# Patient Record
Sex: Male | Born: 1970 | Race: Black or African American | Hispanic: No | Smoking: Never smoker
Health system: Southern US, Community
[De-identification: ages and names within clinical notes are randomized; demographics above are authoritative.]

## PROBLEM LIST (undated history)

## (undated) DIAGNOSIS — T7840XA Allergy, unspecified, initial encounter: Secondary | ICD-10-CM

---

## 1999-05-30 ENCOUNTER — Emergency Department (HOSPITAL_COMMUNITY): Admission: EM | Admit: 1999-05-30 | Discharge: 1999-05-30 | Payer: Self-pay | Admitting: Internal Medicine

## 2000-04-15 ENCOUNTER — Emergency Department (HOSPITAL_COMMUNITY): Admission: EM | Admit: 2000-04-15 | Discharge: 2000-04-15 | Payer: Self-pay | Admitting: Emergency Medicine

## 2000-04-17 ENCOUNTER — Emergency Department (HOSPITAL_COMMUNITY): Admission: EM | Admit: 2000-04-17 | Discharge: 2000-04-17 | Payer: Self-pay | Admitting: Emergency Medicine

## 2002-04-07 ENCOUNTER — Emergency Department (HOSPITAL_COMMUNITY): Admission: EM | Admit: 2002-04-07 | Discharge: 2002-04-07 | Payer: Self-pay | Admitting: Emergency Medicine

## 2011-07-13 ENCOUNTER — Encounter (HOSPITAL_COMMUNITY): Payer: Self-pay | Admitting: Emergency Medicine

## 2011-07-13 ENCOUNTER — Emergency Department (HOSPITAL_COMMUNITY)
Admission: EM | Admit: 2011-07-13 | Discharge: 2011-07-13 | Disposition: A | Discharge status: Home / Self Care | Payer: PRIVATE HEALTH INSURANCE | Attending: Emergency Medicine | Admitting: Emergency Medicine

## 2011-07-13 DIAGNOSIS — M542 Cervicalgia: Secondary | ICD-10-CM | POA: Insufficient documentation

## 2011-07-13 DIAGNOSIS — B349 Viral infection, unspecified: Secondary | ICD-10-CM

## 2011-07-13 DIAGNOSIS — B9789 Other viral agents as the cause of diseases classified elsewhere: Secondary | ICD-10-CM | POA: Insufficient documentation

## 2011-07-13 DIAGNOSIS — R059 Cough, unspecified: Secondary | ICD-10-CM | POA: Insufficient documentation

## 2011-07-13 DIAGNOSIS — R51 Headache: Secondary | ICD-10-CM | POA: Insufficient documentation

## 2011-07-13 DIAGNOSIS — R05 Cough: Secondary | ICD-10-CM | POA: Insufficient documentation

## 2011-07-13 MED ORDER — NAPROXEN 500 MG PO TABS: 500.0000 mg | ORAL_TABLET | Freq: Two times a day (BID) | ORAL | Status: AC

## 2011-07-13 MED ORDER — IBUPROFEN 800 MG PO TABS
800.0000 mg | ORAL_TABLET | Freq: Once | ORAL | Status: AC
  Administered 2011-07-13: 800 mg via ORAL
  Filled 2011-07-13: qty 1

## 2011-07-13 NOTE — ED Provider Notes (Signed)
History     CSN: [id number]  Arrival date & time 07/13/11  1005   First MD Initiated Contact with Patient 07/13/11 1044      Chief Complaint  Patient presents with  . Headache    Headache x 4 days, worse in the am    (Consider location/radiation/quality/duration/timing/severity/associated sxs/prior treatment) HPI Patient states he had a headache it's been off and on for the last 4 days.  That time she's had some discomfort in his neck. He is felt feverish as well and has had a little bit of a cough. He denies any sore throat or nasal congestion. He has not had any vomiting or diarrhea. He has not had any trouble with his balance or coordination. No focal numbness or weakness. Is not fusion. The headache has been waxing and waning. This morning it was more severe but it has mostly resolved at this point. Patient has not seen any one else prior to this. He has been trying some Allegra-D over-the-counter. No past medical history on file.  No past surgical history on file.  No family history on file.  History  Substance Use Topics  . Smoking status: Not on file  . Smokeless tobacco: Not on file  . Alcohol Use: Not on file      Review of Systems  All other systems reviewed and are negative.    Allergies  Review of patient's allergies indicates no known allergies.  Home Medications   Current Outpatient Rx  Name Route Sig Dispense Refill  . FEXOFENADINE-PSEUDOEPHED ER 180-240 MG PO TB24 Oral Take 1 tablet by mouth daily.      BP 117/70  Pulse 62  Temp(Src) 99.8 F (37.7 C) (Oral)  Resp 18  SpO2 100%  Physical Exam  Nursing note and vitals reviewed. Constitutional: He is oriented to person, place, and time. He appears well-developed and well-nourished. No distress.  HENT:  Head: Normocephalic and atraumatic.  Right Ear: External ear normal.  Left Ear: External ear normal.  Eyes: Conjunctivae are normal. Right eye exhibits no discharge. Left eye exhibits no  discharge. No scleral icterus.  Neck: Neck supple. No tracheal deviation present.       No pain with range of motion, no meningeal signs, mild lymphadenopathy left anterior cervical region  Cardiovascular: Normal rate, regular rhythm and intact distal pulses.   Pulmonary/Chest: Effort normal and breath sounds normal. No stridor. No respiratory distress. He has no wheezes. He has no rales.  Abdominal: Soft. Bowel sounds are normal. He exhibits no distension. There is no tenderness. There is no rebound and no guarding.  Musculoskeletal: He exhibits no edema and no tenderness.  Neurological: He is alert and oriented to person, place, and time. He has normal strength. No cranial nerve deficit ( no gross defecits noted) or sensory deficit. He exhibits normal muscle tone. He displays no seizure activity. Coordination and gait normal.       No focal neurologic deficits, 5 over 5 strength upper and lower extremities, sensation to light touch is intact throughout  Skin: Skin is warm and dry. No rash noted.  Psychiatric: He has a normal mood and affect.    ED Course  Procedures (including critical care time)  Labs Reviewed - No data to display No results found.   No diagnosis found.    MDM  Patient does not show any evidence to suggest meningitis. His symptoms rather mild. It is possible he has flu type illness although he does not have any significant  amount of cough. Patient has a normal neurologic exam. He is not febrile here. I will treat him with ibuprofen and have him followup with a primary care Dr. to be rechecked if not better within the week. He is encouraged to return to emergency room for worsening symptoms       Celene Kras, MD 07/13/11 1054

## 2011-07-13 NOTE — ED Notes (Signed)
Pt reports headache x 4 days

## 2011-07-13 NOTE — Discharge Instructions (Signed)
Headache °Headaches are caused by many different problems. Most commonly, headache is caused by muscle tension from an injury, fatigue, or emotional upset. Excessive muscle contractions in the scalp and neck result in a headache that often feels like a tight band around the head. Tension headaches often have areas of tenderness over the scalp and the back of the neck. These headaches may last for hours, days, or longer, and some may contribute to migraines in those who have migraine problems. °Migraines usually cause a throbbing headache, which is made worse by activity. Sometimes only one side of the head hurts. Nausea, vomiting, eye pain, and avoidance of food are common with migraines. Visual symptoms such as light sensitivity, blind spots, or flashing lights may also occur. Loud noises may worsen migraine headaches. Many factors may cause migraine headaches: °· Emotional stress, lack of sleep, and menstrual periods.  °· Alcohol and some drugs (such as birth control pills).  °· Diet factors (fasting, caffeine, food preservatives, chocolate).  °· Environmental factors (weather changes, bright lights, odors, smoke).  °Other causes of headaches include minor injuries to the head. Arthritis in the neck; problems with the jaw, eyes, ears, or nose are also causes of headaches. Allergies, drugs, alcohol, and exposure to smoke can also cause moderate headaches. Rebound headaches can occur if someone uses pain medications for a long period of time and then stops. Less commonly, blood vessel problems in the neck and brain (including stroke) can cause various types of headache. °Treatment of headaches includes medicines for pain and relaxation. Ice packs or heat applied to the back of the head and neck help some people. Massaging the shoulders, neck and scalp are often very useful. Relaxation techniques and stretching can help prevent these headaches. Avoid alcohol and cigarette smoking as these tend to make headaches  worse. Please see your caregiver if your headache is not better in 2 days.  °SEEK IMMEDIATE MEDICAL CARE IF:  °· You develop a high fever, chills, or repeated vomiting.  °· You faint or have difficulty with vision.  °· You develop unusual numbness or weakness of your arms or legs.  °· Relief of pain is inadequate with medication, or you develop severe pain.  °· You develop confusion, or neck stiffness.  °· You have a worsening of a headache or do not obtain relief.  °Document Released: 06/14/2005 Document Revised: 02/24/2011 Document Reviewed: 12/08/2006 °[employer]® Patient Information ©2012 [employer]. °

## 2012-09-11 ENCOUNTER — Encounter (HOSPITAL_COMMUNITY): Payer: Self-pay | Admitting: *Deleted

## 2012-09-11 ENCOUNTER — Emergency Department (HOSPITAL_COMMUNITY): Payer: Worker's Compensation

## 2012-09-11 ENCOUNTER — Emergency Department (HOSPITAL_COMMUNITY)
Admission: EM | Admit: 2012-09-11 | Discharge: 2012-09-11 | Disposition: A | Discharge status: Home / Self Care | Payer: Worker's Compensation | Attending: Emergency Medicine | Admitting: Emergency Medicine

## 2012-09-11 DIAGNOSIS — Y9269 Other specified industrial and construction area as the place of occurrence of the external cause: Secondary | ICD-10-CM | POA: Insufficient documentation

## 2012-09-11 DIAGNOSIS — M7700 Medial epicondylitis, unspecified elbow: Secondary | ICD-10-CM | POA: Insufficient documentation

## 2012-09-11 DIAGNOSIS — M7701 Medial epicondylitis, right elbow: Secondary | ICD-10-CM

## 2012-09-11 DIAGNOSIS — Z79899 Other long term (current) drug therapy: Secondary | ICD-10-CM | POA: Insufficient documentation

## 2012-09-11 DIAGNOSIS — IMO0002 Reserved for concepts with insufficient information to code with codable children: Secondary | ICD-10-CM | POA: Insufficient documentation

## 2012-09-11 DIAGNOSIS — Y990 Civilian activity done for income or pay: Secondary | ICD-10-CM | POA: Insufficient documentation

## 2012-09-11 MED ORDER — NAPROXEN 500 MG PO TABS: 500.0000 mg | ORAL_TABLET | Freq: Two times a day (BID) | ORAL | Status: DC

## 2012-09-11 NOTE — ED Provider Notes (Signed)
Medical screening examination/treatment/procedure(s) were performed by non-physician practitioner and as supervising physician I was immediately available for consultation/collaboration.   Lyanne Co, MD 09/11/12 [phone]

## 2012-09-11 NOTE — ED Provider Notes (Signed)
History     CSN: [id number]  Arrival date & time 09/11/12  6213   First MD Initiated Contact with Patient 09/11/12 [phone]      Chief Complaint  Patient presents with  . Arm Pain    right    (Consider location/radiation/quality/duration/timing/severity/associated sxs/prior treatment) HPI Comments: Pt presents to the ED for right elbow pain.  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  Did not have the injury evaluated and states pain was short lived.  Over the past few days pain has increased and he is having intermittent numbness and paresthesias of his right hand.  Recently changed his position at work and now he is doing more repetitive movements.  Pain has now shifted to the medial aspect of his elbow. No prior right elbow injury.  Patient is a 42 y.o. male presenting with arm pain. The history is provided by the patient.  Arm Pain Associated symptoms include arthralgias.    History reviewed. No pertinent past medical history.  History reviewed. No pertinent past surgical history.  Family History  Problem Relation Age of Onset  . Hypertension Father     History  Substance Use Topics  . Smoking status: Never Smoker   . Smokeless tobacco: Not on file  . Alcohol Use: No      Review of Systems  Musculoskeletal: Positive for arthralgias.  All other systems reviewed and are negative.    Allergies  Review of patient's allergies indicates no known allergies.  Home Medications   Current Outpatient Rx  Name  Route  Sig  Dispense  Refill  . fexofenadine-pseudoephedrine (ALLEGRA-D 24) 180-240 MG per 24 hr tablet   Oral   Take 1 tablet by mouth daily.           BP 123/88  Pulse 54  Temp(Src) 97 F (36.1 C) (Oral)  Resp 18  SpO2 100%  Physical Exam  Nursing note and vitals reviewed. Constitutional: He is oriented to person, place, and time. He appears well-developed and well-nourished.  HENT:  Head:  Normocephalic and atraumatic.  Eyes: Conjunctivae and EOM are normal.  Neck: Normal range of motion.  Cardiovascular: Normal rate, regular rhythm and normal heart sounds.   Pulmonary/Chest: Effort normal and breath sounds normal. He has no wheezes.  Musculoskeletal: Normal range of motion.       Right elbow: He exhibits normal range of motion, no swelling, no deformity and no laceration. Tenderness found. Medial epicondyle tenderness noted.  Normal sensation in right extremitity  Neurological: He is alert and oriented to person, place, and time.  Skin: Skin is warm and dry.  Psychiatric: He has a normal mood and affect.    ED Course  Procedures (including critical care time)  Labs Reviewed - No data to display Dg Elbow Complete Right  09/11/2012  *RADIOLOGY REPORT*  Clinical Data: Elbow pain.  RIGHT ELBOW - COMPLETE 3+ VIEW  Comparison: None.  Findings: No effusion.  No fracture.  Anatomic alignment of the elbow.  Soft tissues appear normal.  IMPRESSION: Negative.   Original Report Authenticated By: Andreas Newport, M.D.      1. Medial epicondylitis of elbow, right       MDM   42 y.o. Male presents to the ED for right elbow pain after an injury approx 2 weeks ago at work.  Recent changes to his position at work now requiring him to do repetitive movements.  Subsequently pain is  located along medial epicondyle.  X-rays negative for acute fx.  Most likely golfers elbow.  No hx of renal impairment, will start on naprosyn daily.  Also instructed that he may also use an elbow brace for added relief, especially while working.  Does not currently have PCP, given resource list and encouraged to follow up.  Return precautions discussed.        Garlon Hatchet, PA-C 09/11/12 [phone]

## 2012-09-11 NOTE — ED Notes (Signed)
Patient states injured arm x 2 weeks ago but over past few days right elbow pain increasing, patient initially hurt elbow by hitting it with chain while working

## 2013-08-31 ENCOUNTER — Ambulatory Visit: Payer: Self-pay | Admitting: Physician Assistant

## 2013-08-31 VITALS — BP 110/60 | HR 50 | Temp 98.5°F | Resp 16 | Ht 71.5 in | Wt 240.0 lb

## 2013-08-31 DIAGNOSIS — Z0289 Encounter for other administrative examinations: Secondary | ICD-10-CM

## 2013-08-31 NOTE — Progress Notes (Signed)
U/A SpGr- 1.015 Protein- Trace Blood- Neg Glucose- Neg

## 2013-08-31 NOTE — Progress Notes (Signed)
Patient ID: Christopher RoyalWilliam M Brumbach MRN: [id number], DOB: 12/05/1970 43 y.o. Date of Encounter: 08/31/2013, 2:44 PM  Primary Physician: No primary provider on file.  Chief Complaint: DOT Physical   HPI: 43 y.o. male with history noted below here for DOT physical. Doing well. No issues/complaints. Recertification.  PCP: None PMH: Seasonal allergies. He sometimes takes Care[employer] adviserAllegra for this. He does does suffer any adverse effects from this medication.  Social: Alcohol denies, tobacco denies, and drug denies Family: See chart No history of MI or OSA No SI or HI No hallucinations From [place]  Review of Systems:  Consitutional: No fever, chills, fatigue, night sweats, lymphadenopathy, or weight changes. Eyes: No visual changes, eye redness, or discharge. ENT/Mouth: Ears: No otalgia, tinnitus, hearing loss, discharge. Nose: No congestion, rhinorrhea, sinus pain, or epistaxis. Throat: No sore throat, post nasal drip, or teeth pain. Cardiovascular: No CP, palpitations, diaphoresis, DOE, edema, orthopnea, PND. Respiratory: No cough, hemoptysis, SOB, or wheezing. Gastrointestinal: No anorexia, dysphagia, reflux, pain, nausea, vomiting, hematemesis, diarrhea, constipation, BRBPR, or melena. Genitourinary: No dysuria, frequency, urgency, hematuria, incontinence, nocturia, decreased urinary stream, discharge, impotence, or testicular pain/masses. Musculoskeletal: No decreased ROM, myalgias, stiffness, joint swelling, or weakness. Skin: No rash, erythema, lesion changes, pain, warmth, jaundice, or pruritis. Neurological: No headache, dizziness, syncope, seizures, tremors, memory loss, coordination problems, or paresthesias. Psychological: No anxiety, depression, hallucinations, SI/HI. Endocrine: No fatigue, polydipsia, polyphagia, polyuria, or known diabetes.   History reviewed. No pertinent past medical history.   History reviewed. No pertinent past surgical history.  Home Meds:  Prior to  Admission medications   Medication Sig Start Date End Date Taking? Authorizing Provider  fexofenadine-pseudoephedrine (ALLEGRA-D 24) 180-240 MG per 24 hr tablet Take 1 tablet by mouth daily.   Yes Historical Provider, MD    Allergies: No Known Allergies  History   Social History  . Marital Status: Married    Spouse Name: N/A    Number of Children: N/A  . Years of Education: N/A   Occupational History  . Not on file.   Social History Main Topics  . Smoking status: Never Smoker   . Smokeless tobacco: Not on file  . Alcohol Use: No  . Drug Use: Not on file  . Sexual Activity: Not on file   Other Topics Concern  . Not on file   Social History Narrative  . No narrative on file    Family History  Problem Relation Age of Onset  . Hypertension Father   . Stroke Father   . Thyroid disease Mother     Physical Exam: Blood pressure 110/60, pulse 50, temperature 98.5 F (36.9 C), temperature source Oral, resp. rate 16, height 5' 11.5" (1.816 m), weight 240 lb (108.863 kg), SpO2 100.00%. General: Well developed, well nourished, in no acute distress. HEENT: Normocephalic, atraumatic. Conjunctiva pink, sclera non-icteric. Pupils 2 mm constricting to 1 mm, round, regular, and equally reactive to light and accomodation. EOMI. Internal auditory canal clear. TMs with good cone of light and without pathology. Nasal mucosa pink. Nares are without discharge. No sinus tenderness. Oral mucosa pink. Dentition normal. Pharynx without exudate.   Neck: Supple. Trachea midline. No thyromegaly. Full ROM. No lymphadenopathy. Lungs: Clear to auscultation bilaterally without wheezes, rales, or rhonchi. Breathing is of normal effort and unlabored. Cardiovascular: RRR with S1 S2. No murmurs, rubs, or gallops appreciated. Distal pulses 2+ symmetrically. No carotid or abdominal bruits. Abdomen: Soft, non-tender, non-distended with normoactive bowel sounds. No hepatosplenomegaly or masses. No  rebound/guarding. No CVA  tenderness. Without hernias.  Genitourinary: Uncircumcised male. No penile lesions. Testes descended bilaterally, and smooth without tenderness or masses.  Musculoskeletal: Full range of motion and 5/5 strength throughout. Without swelling, atrophy, tenderness, crepitus, or warmth. Extremities without clubbing, cyanosis, or edema. Calves supple. Skin: Warm and moist without erythema, ecchymosis, wounds, or rash. Neuro: A+Ox3. CN II-XII grossly intact. Moves all extremities spontaneously. Full sensation throughout. Normal gait. DTR 2+ throughout upper and lower extremities. Finger to nose intact. Psych:  Responds to questions appropriately with a normal affect.    Assessment/Plan:  43 y.o. male here for DOT physical. -Cleared -2 year card issued -Form completed -RTC prn  Signed, Eula Listen, MHS, PA-C [facility] [place], [place] [address] [phone] [facility] 08/31/2013 2:44 PM

## 2014-05-18 ENCOUNTER — Ambulatory Visit (INDEPENDENT_AMBULATORY_CARE_PROVIDER_SITE_OTHER): Payer: Self-pay | Admitting: Emergency Medicine

## 2014-05-18 VITALS — BP 120/70 | HR 60 | Temp 98.0°F | Resp 18 | Ht 71.5 in | Wt 238.0 lb

## 2014-05-18 DIAGNOSIS — Z008 Encounter for other general examination: Secondary | ICD-10-CM

## 2014-05-18 DIAGNOSIS — Z0289 Encounter for other administrative examinations: Secondary | ICD-10-CM

## 2014-05-18 NOTE — Progress Notes (Signed)
° °  Subjective:    Patient ID: Christopher Hopkins, male    DOB: 12/08/1970, 43 y.o.   MRN: [id number] This chart was scribed for Christopher GobbleSteven A Daub, MD by Christopher Hopkins, ED Scribe. The patient was seen in Room 11. The patient's care was started at 4:11 PM.   05/18/2014  Chief Complaint  Patient presents with   Employment Physical    dot    HPI HPI Comments: Christopher Hopkins is a 43 y.o. male who presents to the [facility] here for DOT Physical exam. Pt denies all illness or injury in the 5 years. He denies having any prior DOT restrictions except for vision impairment. Pt denies any chronic medical conditions or taking medications. Pt denies seizures, loss of hearing, heart surgery, HTN, muscular disease, SOB, lung disease, kidney disease, liver disease, digestive problems, diabetes, nervous or psychiatric disorders, LOC, syncope, dizziness, weakness, numbness, sleep disorders, stroke, low back pain, or spinal injury.   Review of Systems  Respiratory: Negative for shortness of breath.   Musculoskeletal: Negative for back pain.  Neurological: Negative for dizziness, syncope, weakness and numbness.  Psychiatric/Behavioral: Negative for sleep disturbance and dysphoric mood. The patient is not nervous/anxious.     History reviewed. No pertinent past medical history. History reviewed. No pertinent past surgical history. No Known Allergies Current Outpatient Prescriptions  Medication Sig Dispense Refill   fexofenadine-pseudoephedrine (ALLEGRA-D 24) 180-240 MG per 24 hr tablet Take 1 tablet by mouth daily.     No current facility-administered medications for this visit.       Objective:  Triage Vitals: BP 120/70 mmHg   Pulse 60   Temp(Src) 98 F (36.7 C) (Oral)   Resp 18   Ht 5' 11.5" (1.816 m)   Wt 238 lb (107.956 kg)   BMI 32.74 kg/m2   SpO2 100%  Physical Exam  Constitutional: He is oriented to person, place, and time. He appears well-developed and  well-nourished. No distress.  HENT:  Head: Normocephalic and atraumatic.  Eyes: Conjunctivae and EOM are normal.  Neck: Neck supple. No tracheal deviation present.  Cardiovascular: Normal rate.   Pulmonary/Chest: Effort normal. No respiratory distress.  Musculoskeletal: Normal range of motion.  Neurological: He is alert and oriented to person, place, and time.  Skin: Skin is warm and dry.  Psychiatric: He has a normal mood and affect. His behavior is normal.  Nursing note and vitals reviewed.  No results found for this or any previous visit.     Assessment & Plan:  4:18 PM- Patient informed of current plan for treatment and evaluation and agrees with plan at this time. Patient qualifies for a two-year DOT card.I personally performed the services described in this documentation, which was scribed in my presence. The recorded information has been reviewed and is accurate.

## 2014-05-18 NOTE — Progress Notes (Deleted)
   Subjective:    Patient ID: Christopher RoyalWilliam M Hopkins, male    DOB: 06/08/1971, 43 y.o.   MRN: [id number]  HPI    Review of Systems     Objective:   Physical Exam        Assessment & Plan:

## 2015-10-16 ENCOUNTER — Encounter (HOSPITAL_COMMUNITY): Payer: Self-pay

## 2015-10-16 DIAGNOSIS — N390 Urinary tract infection, site not specified: Secondary | ICD-10-CM | POA: Insufficient documentation

## 2015-10-16 LAB — URINALYSIS, ROUTINE W REFLEX MICROSCOPIC
BILIRUBIN URINE: NEGATIVE
GLUCOSE, UA: NEGATIVE mg/dL
KETONES UR: NEGATIVE mg/dL
Nitrite: NEGATIVE
PROTEIN: NEGATIVE mg/dL
Specific Gravity, Urine: 1.024 (ref 1.005–1.030)
pH: 5.5 (ref 5.0–8.0)

## 2015-10-16 LAB — URINE MICROSCOPIC-ADD ON

## 2015-10-16 NOTE — ED Notes (Signed)
Pt here with c/o one episode of hematuria today just PTA. He denies dysuria or any other pain and denies prostrate problems. This is the first time he has had blood in his urine.

## 2015-10-17 ENCOUNTER — Emergency Department (HOSPITAL_COMMUNITY)
Admission: EM | Admit: 2015-10-17 | Discharge: 2015-10-17 | Disposition: A | Discharge status: Home / Self Care | Payer: PRIVATE HEALTH INSURANCE | Attending: Emergency Medicine | Admitting: Emergency Medicine

## 2015-10-17 DIAGNOSIS — N390 Urinary tract infection, site not specified: Secondary | ICD-10-CM

## 2015-10-17 DIAGNOSIS — R319 Hematuria, unspecified: Secondary | ICD-10-CM

## 2015-10-17 MED ORDER — CIPROFLOXACIN HCL 500 MG PO TABS: 500.0000 mg | ORAL_TABLET | Freq: Two times a day (BID) | ORAL | Status: AC

## 2015-10-17 NOTE — Discharge Instructions (Signed)
Urinary Tract Infection °Urinary tract infections (UTIs) can develop anywhere along your urinary tract. Your urinary tract is your body's drainage system for removing wastes and extra water. Your urinary tract includes two kidneys, two ureters, a bladder, and a urethra. Your kidneys are a pair of bean-shaped organs. Each kidney is about the size of your fist. They are located below your ribs, one on each side of your spine. °CAUSES °Infections are caused by microbes, which are microscopic organisms, including fungi, viruses, and bacteria. These organisms are so small that they can only be seen through a microscope. Bacteria are the microbes that most commonly cause UTIs. °SYMPTOMS  °Symptoms of UTIs may vary by age and gender of the patient and by the location of the infection. Symptoms in young women typically include a frequent and intense urge to urinate and a painful, burning feeling in the bladder or urethra during urination. Older women and men are more likely to be tired, shaky, and weak and have muscle aches and abdominal pain. A fever may mean the infection is in your kidneys. Other symptoms of a kidney infection include pain in your back or sides below the ribs, nausea, and vomiting. °DIAGNOSIS °To diagnose a UTI, your caregiver will ask you about your symptoms. Your caregiver will also ask you to provide a urine sample. The urine sample will be tested for bacteria and white blood cells. White blood cells are made by your body to help fight infection. °TREATMENT  °Typically, UTIs can be treated with medication. Because most UTIs are caused by a bacterial infection, they usually can be treated with the use of antibiotics. The choice of antibiotic and length of treatment depend on your symptoms and the type of bacteria causing your infection. °HOME CARE INSTRUCTIONS °· If you were prescribed antibiotics, take them exactly as your caregiver instructs you. Finish the medication even if you feel better after  you have only taken some of the medication. °· Drink enough water and fluids to keep your urine clear or pale yellow. °· Avoid caffeine, tea, and carbonated beverages. They tend to irritate your bladder. °· Empty your bladder often. Avoid holding urine for long periods of time. °· Empty your bladder before and after sexual intercourse. °· After a bowel movement, women should cleanse from front to back. Use each tissue only once. °SEEK MEDICAL CARE IF:  °· You have back pain. °· You develop a fever. °· Your symptoms do not begin to resolve within 3 days. °SEEK IMMEDIATE MEDICAL CARE IF:  °· You have severe back pain or lower abdominal pain. °· You develop chills. °· You have nausea or vomiting. °· You have continued burning or discomfort with urination. °MAKE SURE YOU:  °· Understand these instructions. °· Will watch your condition. °· Will get help right away if you are not doing well or get worse. °  °This information is not intended to replace advice given to you by your health care provider. Make sure you discuss any questions you have with your health care provider. °  °Document Released: 03/24/2005 Document Revised: 03/05/2015 Document Reviewed: 07/23/2011 °Elsevier Interactive Patient Education ©2016 Elsevier Inc. ° °Hematuria, Adult °Hematuria is blood in your urine. It can be caused by a bladder infection, kidney infection, prostate infection, kidney stone, or cancer of your urinary tract. Infections can usually be treated with medicine, and a kidney stone usually will pass through your urine. If neither of these is the cause of your hematuria, further workup to find out   the reason may be needed. °It is very important that you tell your health care provider about any blood you see in your urine, even if the blood stops without treatment or happens without causing pain. Blood in your urine that happens and then stops and then happens again can be a symptom of a very serious condition. Also, pain is not a  symptom in the initial stages of many urinary cancers. °HOME CARE INSTRUCTIONS  °· Drink lots of fluid, 3-4 quarts a day. If you have been diagnosed with an infection, cranberry juice is especially recommended, in addition to large amounts of water. °· Avoid caffeine, tea, and carbonated beverages because they tend to irritate the bladder. °· Avoid alcohol because it may irritate the prostate. °· Take all medicines as directed by your health care provider. °· If you were prescribed an antibiotic medicine, finish it all even if you start to feel better. °· If you have been diagnosed with a kidney stone, follow your health care provider's instructions regarding straining your urine to catch the stone. °· Empty your bladder often. Avoid holding urine for long periods of time. °· After a bowel movement, women should cleanse front to back. Use each tissue only once. °· Empty your bladder before and after sexual intercourse if you are a male. °SEEK MEDICAL CARE IF: °· You develop back pain. °· You have a fever. °· You have a feeling of sickness in your stomach (nausea) or vomiting. °· Your symptoms are not better in 3 days. Return sooner if you are getting worse. °SEEK IMMEDIATE MEDICAL CARE IF:  °· You develop severe vomiting and are unable to keep the medicine down. °· You develop severe back or abdominal pain despite taking your medicines. °· You begin passing a large amount of blood or clots in your urine. °· You feel extremely weak or faint, or you pass out. °MAKE SURE YOU:  °· Understand these instructions. °· Will watch your condition. °· Will get help right away if you are not doing well or get worse. °  °This information is not intended to replace advice given to you by your health care provider. Make sure you discuss any questions you have with your health care provider. °  °Document Released: 06/14/2005 Document Revised: 07/05/2014 Document Reviewed: 02/12/2013 °Elsevier Interactive Patient Education ©2016  Elsevier Inc. ° °

## 2015-10-17 NOTE — ED Provider Notes (Signed)
CSN: [id number]     Arrival date & time 10/16/15  2132 History  By signing my name below, I, Christopher Hopkins, attest that this documentation has been prepared under the direction and in the presence of Shon Baton, MD. Electronically Signed: Octavia Heir, ED Scribe. 10/17/2015. 2:17 AM.     Chief Complaint  Patient presents with  . Hematuria      The history is provided by the patient. No language interpreter was used.   HPI Comments: Christopher Hopkins is a 45 y.o. male who presents to the Emergency Department complaining of sudden onset, moderate, hematuria onset PTA. Pt states he went to the bathroom this evening and saw some light blood in his urine. He has not had this happen before. Denies abdominal pain, abdominal pressure, dysuria, back pain, fever, use of blood thinners, or penile discharge.  History reviewed. No pertinent past medical history. History reviewed. No pertinent past surgical history. Family History  Problem Relation Age of Onset  . Hypertension Father   . Stroke Father   . Thyroid disease Mother    Social History  Substance Use Topics  . Smoking status: Never Smoker   . Smokeless tobacco: None  . Alcohol Use: No    Review of Systems  Constitutional: Negative for fever.  Gastrointestinal: Negative for abdominal pain.  Genitourinary: Positive for hematuria. Negative for flank pain and discharge.  Musculoskeletal: Negative for back pain.  All other systems reviewed and are negative.     Allergies  Review of patient's allergies indicates no known allergies.  Home Medications   Prior to Admission medications   Medication Sig Start Date End Date Taking? Authorizing Provider  ciprofloxacin (CIPRO) 500 MG tablet Take 1 tablet (500 mg total) by mouth 2 (two) times daily. 10/17/15   Shon Baton, MD   Triage vitals: BP 125/87 mmHg  Pulse 85  Temp(Src) 98.6 F (37 C) (Oral)  Resp 16  SpO2 98% Physical Exam  Constitutional: He is oriented  to person, place, and time. He appears well-developed and well-nourished. No distress.  HENT:  Head: Normocephalic and atraumatic.  Cardiovascular: Normal rate, regular rhythm and normal heart sounds.   Pulmonary/Chest: Effort normal and breath sounds normal. No respiratory distress.  Abdominal: Soft. Bowel sounds are normal. There is no tenderness. There is no rebound.  Musculoskeletal: He exhibits no edema.  Neurological: He is alert and oriented to person, place, and time.  Skin: Skin is warm and dry.  Psychiatric: He has a normal mood and affect.  Nursing note and vitals reviewed.   ED Course  Procedures  DIAGNOSTIC STUDIES: Oxygen Saturation is 98% on RA, normal by my interpretation.  COORDINATION OF CARE:  2:17 AM Discussed treatment plan with pt at bedside and pt agreed to plan.  Labs Review Labs Reviewed  URINALYSIS, ROUTINE W REFLEX MICROSCOPIC (NOT AT [facility]) - Abnormal; Notable for the following:    Hgb urine dipstick MODERATE (*)    Leukocytes, UA SMALL (*)    All other components within normal limits  URINE MICROSCOPIC-ADD ON - Abnormal; Notable for the following:    Squamous Epithelial / LPF 0-5 (*)    Bacteria, UA RARE (*)    All other components within normal limits  URINE CULTURE    Imaging Review No results found. I have personally reviewed and evaluated these images and lab results as part of my medical decision-making.   EKG Interpretation None      MDM   Final diagnoses:  Hematuria  UTI (lower urinary tract infection)    Patient presents with hematuria. Denies any other complaints at this time. No history of the same. Nontoxic. Afebrile. Physical exam is unremarkable. Urinalysis was 6-30 white cells, 6-30 red cells, and rare bacteria. Will obtain a urine culture. Will treat for infection with Cipro. Follow-up with urology if symptoms persist.  After history, exam, and medical workup I feel the patient has been appropriately medically screened and  is safe for discharge home. Pertinent diagnoses were discussed with the patient. Patient was given return precautions.  I personally performed the services described in this documentation, which was scribed in my presence. The recorded information has been reviewed and is accurate.   Shon Batonourtney F Horton, MD 10/17/15 [phone]

## 2015-10-18 LAB — URINE CULTURE: Culture: NO GROWTH

## 2019-04-29 ENCOUNTER — Encounter (HOSPITAL_COMMUNITY): Payer: Self-pay

## 2019-04-29 ENCOUNTER — Other Ambulatory Visit: Payer: Self-pay

## 2019-04-29 ENCOUNTER — Emergency Department (HOSPITAL_COMMUNITY)
Admission: EM | Admit: 2019-04-29 | Discharge: 2019-04-29 | Disposition: A | Discharge status: Home / Self Care | Payer: PRIVATE HEALTH INSURANCE | Attending: Emergency Medicine | Admitting: Emergency Medicine

## 2019-04-29 DIAGNOSIS — Z202 Contact with and (suspected) exposure to infections with a predominantly sexual mode of transmission: Secondary | ICD-10-CM | POA: Insufficient documentation

## 2019-04-29 DIAGNOSIS — N4829 Other inflammatory disorders of penis: Secondary | ICD-10-CM | POA: Insufficient documentation

## 2019-04-29 DIAGNOSIS — Z711 Person with feared health complaint in whom no diagnosis is made: Secondary | ICD-10-CM

## 2019-04-29 LAB — URINALYSIS, ROUTINE W REFLEX MICROSCOPIC
Bilirubin Urine: NEGATIVE
Glucose, UA: NEGATIVE mg/dL
Hgb urine dipstick: NEGATIVE
Ketones, ur: NEGATIVE mg/dL
Leukocytes,Ua: NEGATIVE
Nitrite: NEGATIVE
Protein, ur: NEGATIVE mg/dL
Specific Gravity, Urine: 1.015 (ref 1.005–1.030)
pH: 5 (ref 5.0–8.0)

## 2019-04-29 LAB — RAPID HIV SCREEN (HIV 1/2 AB+AG)
HIV 1/2 Antibodies: NONREACTIVE
HIV-1 P24 Antigen - HIV24: NONREACTIVE

## 2019-04-29 MED ORDER — CEFTRIAXONE SODIUM 250 MG IJ SOLR
250.0000 mg | Freq: Once | INTRAMUSCULAR | Status: AC
  Administered 2019-04-29: 250 mg via INTRAMUSCULAR
  Filled 2019-04-29: qty 250

## 2019-04-29 MED ORDER — AZITHROMYCIN 250 MG PO TABS
1000.0000 mg | ORAL_TABLET | Freq: Once | ORAL | Status: AC
  Administered 2019-04-29: 1000 mg via ORAL
  Filled 2019-04-29: qty 4

## 2019-04-29 NOTE — Discharge Instructions (Signed)
We will follow up with the results of the remaining lab work when it is available. Return to the ED if you start to have worsening symptoms, testicular pain or swelling, increase in rashes.

## 2019-04-29 NOTE — ED Notes (Signed)
Patient verbalizes understanding of discharge instructions. Opportunity for questioning and answers were provided. Armband removed by staff, pt discharged from ED.  

## 2019-04-29 NOTE — ED Provider Notes (Signed)
[facility] EMERGENCY DEPARTMENT Provider Note   CSN: [id number] Arrival date & time: 04/29/19  7096     History   Chief Complaint Chief Complaint  Patient presents with  . urinary problem/possible infection    HPI Christopher Hopkins is a 48 y.o. male with no significant past medical history presenting to the ED for possible UTI or STI.  States that he was told by his sexual partner 1 week ago that she either had a UTI or STI.  Patient had unprotected sexual intercourse with her approximately 1 week ago.  He began noticing fluid-filled lesions on his penis yesterday.  Denies any dysuria, penile discharge or testicular swelling.  No history of similar symptoms in the past.  He denies any known STD exposures.      HPI  No past medical history on file.  There are no active problems to display for this patient.   No past surgical history on file.      Home Medications    Prior to Admission medications   Medication Sig Start Date End Date Taking? Authorizing Provider  ciprofloxacin (CIPRO) 500 MG tablet Take 1 tablet (500 mg total) by mouth 2 (two) times daily. 10/17/15   Horton, Mayer Masker, MD    Family History Family History  Problem Relation Age of Onset  . Hypertension Father   . Stroke Father   . Thyroid disease Mother     Social History Social History   Tobacco Use  . Smoking status: Never Smoker  Substance Use Topics  . Alcohol use: No  . Drug use: Not on file     Allergies   Patient has no known allergies.   Review of Systems Review of Systems  Constitutional: Negative for chills and fever.  Gastrointestinal: Negative for abdominal pain.  Genitourinary: Positive for genital sores. Negative for discharge and dysuria.     Physical Exam Updated Vital Signs BP 125/78 (BP Location: Right Arm)   Pulse (!) 52   Temp 98.8 F (37.1 C) (Oral)   Resp 16   Ht 6\' 1"  (1.854 m)   Wt 108.9 kg   SpO2 99%   BMI 31.66 kg/m    Physical Exam Vitals signs and nursing note reviewed. Exam conducted with a chaperone present.  Constitutional:      General: He is not in acute distress.    Appearance: He is well-developed. He is not diaphoretic.  HENT:     Head: Normocephalic and atraumatic.  Eyes:     General: No scleral icterus.    Conjunctiva/sclera: Conjunctivae normal.  Neck:     Musculoskeletal: Normal range of motion.  Pulmonary:     Effort: Pulmonary effort is normal. No respiratory distress.  Genitourinary:    Penis: Uncircumcised. Lesions (fluid filled) present.      Comments: Normal male genitalia noted. Penis, scrotum, and testicles without swelling, tenderness present. No penile discharge noted. Cremasteric reflex intact. RN served as during the exam.  Skin:    Findings: No rash.  Neurological:     Mental Status: He is alert.      ED Treatments / Results  Labs (all labs ordered are listed, but only abnormal results are displayed) Labs Reviewed  URINALYSIS, ROUTINE W REFLEX MICROSCOPIC - Abnormal; Notable for the following components:      Result Value   Color, Urine STRAW (*)    All other components within normal limits  URINE CULTURE  HSV CULTURE AND TYPING  RAPID HIV  SCREEN (HIV 1/2 AB+AG)  RPR  GC/CHLAMYDIA PROBE AMP ([place]) NOT AT [facility]    EKG None  Radiology No results found.  Procedures Procedures (including critical care time)  Medications Ordered in ED Medications  cefTRIAXone (ROCEPHIN) injection 250 mg (250 mg Intramuscular Given 04/29/19 1059)  azithromycin (ZITHROMAX) tablet 1,000 mg (1,000 mg Oral Given 04/29/19 1100)     Initial Impression / Assessment and Plan / ED Course  I have reviewed the triage vital signs and the nursing notes.  Pertinent labs & imaging results that were available during my care of the patient were reviewed by me and considered in my medical decision making (see chart for details).        48 year old male presents to  ED for possible STD versus UTI.  Patient unclear about what his male sexual partner was diagnosed with although he does report unprotected sexual intercourse 1 week ago.  He has noticed some fluid-filled lesions on the shaft of his penis but denies pain particularly.  Denies any dysuria or penile discharge, testicular pain.  Patient overall well-appearing on exam.  HSV culture obtained and patient elects to defer treatment until results.  Remainder of STD panel is pending peer urinalysis unremarkable.  Patient treated for gonorrhea and chlamydia and will be advised to follow-up on results when available.  Return precautions given.  Patient is hemodynamically stable, in NAD, and able to ambulate in the ED. Evaluation does not show pathology that would require ongoing emergent intervention or inpatient treatment. I explained the diagnosis to the patient. Pain has been managed and has no complaints prior to discharge. Patient is comfortable with above plan and is stable for discharge at this time. All questions were answered prior to disposition. Strict return precautions for returning to the ED were discussed. Encouraged follow up with PCP.   An After Visit Summary was printed and given to the patient.   Portions of this note were generated with [employer]. Dictation errors may occur despite best attempts at proofreading.   Final Clinical Impressions(s) / ED Diagnoses   Final diagnoses:  Concern about STD in male without diagnosis    ED Discharge Orders    None       Delia Heady, PA-C 04/29/19 1122    Carmin Muskrat, MD 04/30/19 [phone]

## 2019-04-29 NOTE — ED Triage Notes (Signed)
Pt arrives POV to the ED from home c/o UTI or "possible STI." Pt states girlfriend informed him that he has seen 2 providers and "1 said she has a UTI and the other said she has a STI." Pt is here in the ED for evaluation.

## 2019-04-30 LAB — URINE CULTURE: Culture: 20000 — AB

## 2019-04-30 LAB — RPR: RPR Ser Ql: NONREACTIVE

## 2019-05-01 ENCOUNTER — Telehealth: Payer: Self-pay | Admitting: Emergency Medicine

## 2019-05-01 LAB — GC/CHLAMYDIA PROBE AMP (~~LOC~~) NOT AT ARMC
Chlamydia: NEGATIVE
Neisseria Gonorrhea: NEGATIVE

## 2019-05-01 NOTE — Telephone Encounter (Signed)
Post ED Visit - Positive Culture Follow-up  Culture report reviewed by antimicrobial stewardship pharmacist: [place] Team []  Elenor Quinones, Pharm.D. []  Heide Guile, Pharm.D., BCPS AQ-ID []  Parks Neptune, Pharm.D., BCPS []  Alycia Rossetti, Pharm.D., BCPS []  [place], Pharm.D., BCPS, AAHIVP []  Legrand Como, Pharm.D., BCPS, AAHIVP []  Salome Arnt, PharmD, BCPS []  Johnnette Gourd, PharmD, BCPS []  Hughes Better, PharmD, BCPS []  Leeroy Cha, PharmD []  Laqueta Linden, PharmD, BCPS []  Albertina Parr, PharmD Gerre Pebbles PharmD  [place] Team []  Leodis Sias, PharmD []  Lindell Spar, PharmD []  Royetta Asal, PharmD []  Graylin Shiver, Rph []  Rema Fendt) Glennon Mac, PharmD []  Arlyn Dunning, PharmD []  Netta Cedars, PharmD []  Dia Sitter, PharmD []  Leone Haven, PharmD []  Gretta Arab, PharmD []  Theodis Shove, PharmD []  Peggyann Juba, PharmD []  Reuel Boom, PharmD   Positive urine culture Treated with ceftriaxone, organism sensitive to the same and no further patient follow-up is required at this time.  Hazle Nordmann 05/01/2019, 9:47 AM

## 2019-05-02 LAB — HSV CULTURE AND TYPING

## 2019-10-27 ENCOUNTER — Encounter (HOSPITAL_COMMUNITY): Payer: Self-pay | Admitting: Emergency Medicine

## 2019-10-27 ENCOUNTER — Emergency Department (HOSPITAL_COMMUNITY): Payer: Medicaid Other

## 2019-10-27 ENCOUNTER — Emergency Department (HOSPITAL_COMMUNITY)
Admission: EM | Admit: 2019-10-27 | Discharge: 2019-10-28 | Disposition: A | Discharge status: Home / Self Care | Payer: Medicaid Other | Attending: Emergency Medicine | Admitting: Emergency Medicine

## 2019-10-27 ENCOUNTER — Other Ambulatory Visit: Payer: Self-pay

## 2019-10-27 DIAGNOSIS — R0789 Other chest pain: Secondary | ICD-10-CM | POA: Insufficient documentation

## 2019-10-27 DIAGNOSIS — M79602 Pain in left arm: Secondary | ICD-10-CM | POA: Diagnosis not present

## 2019-10-27 DIAGNOSIS — R519 Headache, unspecified: Secondary | ICD-10-CM | POA: Diagnosis not present

## 2019-10-27 DIAGNOSIS — R079 Chest pain, unspecified: Secondary | ICD-10-CM | POA: Diagnosis present

## 2019-10-27 MED ORDER — SODIUM CHLORIDE 0.9% FLUSH: 3.0000 mL | Freq: Once | INTRAVENOUS | Status: DC

## 2019-10-27 NOTE — ED Triage Notes (Signed)
Pt c/o left side cp and numbness on his left arm and HA.

## 2019-10-28 LAB — CBC
HCT: 45.9 % (ref 39.0–52.0)
Hemoglobin: 15.2 g/dL (ref 13.0–17.0)
MCH: 30.2 pg (ref 26.0–34.0)
MCHC: 33.1 g/dL (ref 30.0–36.0)
MCV: 91.3 fL (ref 80.0–100.0)
Platelets: 203 10*3/uL (ref 150–400)
RBC: 5.03 MIL/uL (ref 4.22–5.81)
RDW: 13 % (ref 11.5–15.5)
WBC: 3.3 10*3/uL — ABNORMAL LOW (ref 4.0–10.5)
nRBC: 0 % (ref 0.0–0.2)

## 2019-10-28 LAB — BASIC METABOLIC PANEL
Anion gap: 10 (ref 5–15)
BUN: 13 mg/dL (ref 6–20)
CO2: 26 mmol/L (ref 22–32)
Calcium: 9.7 mg/dL (ref 8.9–10.3)
Chloride: 103 mmol/L (ref 98–111)
Creatinine, Ser: 1.28 mg/dL — ABNORMAL HIGH (ref 0.61–1.24)
GFR calc Af Amer: >60 mL/min (ref >60)
GFR calc non Af Amer: >60 mL/min (ref >60)
Glucose, Bld: 91 mg/dL (ref 70–99)
Potassium: 4.1 mmol/L (ref 3.5–5.1)
Sodium: 139 mmol/L (ref 135–145)

## 2019-10-28 LAB — D-DIMER, QUANTITATIVE (NOT AT ARMC): D-Dimer, Quant: 0.34 ug/mL-FEU (ref 0.00–0.50)

## 2019-10-28 LAB — TROPONIN I (HIGH SENSITIVITY)
Troponin I (High Sensitivity): 4 ng/L (ref <18)
Troponin I (High Sensitivity): 3 ng/L (ref <18)

## 2019-10-28 NOTE — ED Provider Notes (Signed)
TIME SEEN: 4:41 AM  CHIEF COMPLAINT: Left arm pain, chest pain  HPI: Patient is a 49 year old male with no significant past medical history who presents to the emergency department with 2 months of intermittent left arm pain that will move up from the elbow into his chest and sometimes into the side of his head.  He describes the pain as a tightness.  He is not having any of the symptoms currently but came to the emergency department because he felt like the symptoms were becoming more frequent.  States he is able to be active, workout without developing the symptoms.  No associated shortness of breath, nausea, vomiting, diaphoresis, dizziness.  No lower extremity swelling or pain.  He denies history of CAD, history of stress test or catheterization, PE or DVT.  Does have a family history of a father with CAD but is not sure when he was first diagnosed.  Does not currently have a primary care doctor.  No history of high blood pressure, diabetes, high cholesterol, tobacco use.  ROS: See HPI Constitutional: no fever  Eyes: no drainage  ENT: no runny nose   Cardiovascular:   chest pain  Resp: no SOB  GI: no vomiting GU: no dysuria Integumentary: no rash  Allergy: no hives  Musculoskeletal: no leg swelling  Neurological: no slurred speech ROS otherwise negative  PAST MEDICAL HISTORY/PAST SURGICAL HISTORY:  History reviewed. No pertinent past medical history.  MEDICATIONS:  Prior to Admission medications   Medication Sig Start Date End Date Taking? Authorizing Provider  ciprofloxacin (CIPRO) 500 MG tablet Take 1 tablet (500 mg total) by mouth 2 (two) times daily. 10/17/15   Horton, Barbette Hair, MD    ALLERGIES:  No Known Allergies  SOCIAL HISTORY:  Social History   Tobacco Use  . Smoking status: Never Smoker  . Smokeless tobacco: Never Used  Substance Use Topics  . Alcohol use: No    FAMILY HISTORY: Family History  Problem Relation Age of Onset  . Hypertension Father   . Stroke  Father   . Thyroid disease Mother     EXAM: BP 127/84 (BP Location: Right Arm)   Pulse (!) 41   Temp 98.1 F (36.7 C) (Oral)   Resp 13   Ht 6\' 1"  (1.854 m)   Wt 113.4 kg   SpO2 99%   BMI 32.98 kg/m  CONSTITUTIONAL: Alert and oriented and responds appropriately to questions. Well-appearing; well-nourished HEAD: Normocephalic EYES: Conjunctivae clear, pupils appear equal, EOM appear intact ENT: normal nose; moist mucous membranes NECK: Supple, normal ROM CARD: RRR; S1 and S2 appreciated; no murmurs, no clicks, no rubs, no gallops RESP: Normal chest excursion without splinting or tachypnea; breath sounds clear and equal bilaterally; no wheezes, no rhonchi, no rales, no hypoxia or respiratory distress, speaking full sentences ABD/GI: Normal bowel sounds; non-distended; soft, non-tender, no rebound, no guarding, no peritoneal signs, no hepatosplenomegaly BACK:  The back appears normal EXT: Normal ROM in all joints; no deformity noted, no edema; no cyanosis, left upper extremity is nontender to palpation with normal sensation diffusely, 2+ left DP pulse normal grip strength, compartments in the left arm are soft, no redness or warmth noted, no joint effusions, no calf tenderness or calf swelling SKIN: Normal color for age and race; warm; no rash on exposed skin NEURO: Moves all extremities equally PSYCH: The patient's mood and manner are appropriate.   MEDICAL DECISION MAKING: Patient here with atypical left-sided chest pain.  No risk factors for ACS other than  age and potential family history.  His EKG has been reviewed/interpreted and shows no ischemic abnormality.  His labs have been reviewed/interpreted and show no significant abnormality other than mildly elevated creatinine of 1.28 with normal BUN.  Suspect this is his normal baseline.  He does have a mild leukopenia of the white blood cell count of 3.3 but no infectious symptoms.  He has had 2 high-sensitivity troponins that have been  negative.  His chest x-ray has been reviewed/interpreted and is clear without acute abnormality.  Low suspicion for ACS at this time.  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  Will obtain D-dimer to rule out PE.  He has been intermittently bradycardic but suspect that this is actually a normal thing for him as he appears to be relatively healthy man that is able to workout regularly.  He does not have any sign of AV block or interval abnormality on EKG today.  ED PROGRESS: D-dimer negative.  Continues to be asymptomatic and hemodynamically stable.  Recommended outpatient PCP follow-up.  Discussed return precautions with patient and fianc at bedside.  They are comfortable with this plan.   At this time, I do not feel there is any life-threatening condition present. I have reviewed, interpreted and discussed all results (EKG, imaging, lab, urine as appropriate) and exam findings with patient/family. I have reviewed nursing notes and appropriate previous records.  I feel the patient is safe to be discharged home without further emergent workup and can continue workup as an outpatient as needed. Discussed usual and customary return precautions. Patient/family verbalize understanding and are comfortable with this plan.  Outpatient follow-up has been provided as needed. All questions have been answered.    EKG Interpretation  Date/Time:  Sunday Oct 28 2019 05:04:11 EDT Ventricular Rate:  60 PR Interval:    QRS Duration: 90 QT Interval:  416 QTC Calculation: 416 R Axis:   70 Text Interpretation: Sinus rhythm No old tracing to compare Confirmed by Rochele Raring [phone]) on 10/28/2019 5:11:38 AM           Kristine Royal was evaluated in Emergency Department on 10/28/2019 for the symptoms described in the history of present illness. He was evaluated in the context of the global COVID-19 pandemic, which necessitated consideration that the patient might be at risk for  infection with the SARS-CoV-2 virus that causes COVID-19. Institutional protocols and algorithms that pertain to the evaluation of patients at risk for COVID-19 are in a state of rapid change based on information released by regulatory bodies including the CDC and federal and state organizations. These policies and algorithms were followed during the patient's care in the ED.      Craige Patel, Layla Maw, DO 10/28/19 [phone]

## 2019-10-28 NOTE — Discharge Instructions (Addendum)
Steps to find a Primary Care Provider (PCP): ° °Call [phone] or [phone] to access "[facility] Find a Doctor Service." ° °2.  You may also go on the [facility] website at [email or web address] ° °3.  [facility] also frequently accepts new patients. ° °[facility]  °[address] °[place] [address] °[phone] ° °4.  There are also multiple [facility], [facility] and [facility] practices throughout the [place] that are frequently accepting new patients. You may find a clinic that is close to your home and contact them. ° °[facility] °[email or web address] °[phone] ° °[facility] °[email or web address] ° °[facility] °[email or web address] °[phone] ° °[facility] °[email or web address] °[phone] ° °5.  Local Health Departments also can provide primary care services. ° °[facility]  °[address] °[place] [address] [phone] ° °[facility] °[address] °[place] [address] [phone] ° °[facility] °[address]  °[place] [address] °[phone] ° ° °

## 2019-11-18 ENCOUNTER — Other Ambulatory Visit: Payer: Self-pay

## 2019-11-18 ENCOUNTER — Encounter (HOSPITAL_COMMUNITY): Payer: Self-pay | Admitting: Emergency Medicine

## 2019-11-18 ENCOUNTER — Emergency Department (HOSPITAL_COMMUNITY)
Admission: EM | Admit: 2019-11-18 | Discharge: 2019-11-18 | Disposition: A | Discharge status: Home / Self Care | Payer: Medicaid Other | Attending: Emergency Medicine | Admitting: Emergency Medicine

## 2019-11-18 DIAGNOSIS — R519 Headache, unspecified: Secondary | ICD-10-CM

## 2019-11-18 DIAGNOSIS — R0981 Nasal congestion: Secondary | ICD-10-CM | POA: Diagnosis present

## 2019-11-18 DIAGNOSIS — Z20822 Contact with and (suspected) exposure to covid-19: Secondary | ICD-10-CM | POA: Diagnosis not present

## 2019-11-18 DIAGNOSIS — J302 Other seasonal allergic rhinitis: Secondary | ICD-10-CM | POA: Insufficient documentation

## 2019-11-18 HISTORY — DX: Allergy, unspecified, initial encounter: T78.40XA

## 2019-11-18 LAB — SARS CORONAVIRUS 2 (TAT 6-24 HRS): SARS Coronavirus 2: NEGATIVE

## 2019-11-18 MED ORDER — PROCHLORPERAZINE MALEATE 5 MG PO TABS
10.0000 mg | ORAL_TABLET | Freq: Once | ORAL | Status: AC
  Administered 2019-11-18: 10 mg via ORAL
  Filled 2019-11-18: qty 2

## 2019-11-18 MED ORDER — DIPHENHYDRAMINE HCL 25 MG PO CAPS
50.0000 mg | ORAL_CAPSULE | Freq: Once | ORAL | Status: AC
  Administered 2019-11-18: 50 mg via ORAL
  Filled 2019-11-18: qty 2

## 2019-11-18 MED ORDER — IBUPROFEN 800 MG PO TABS: 800.0000 mg | ORAL_TABLET | Freq: Once | ORAL | Status: DC

## 2019-11-18 NOTE — ED Notes (Signed)
Patient verbalizes understanding of discharge instructions . Opportunity for questions and answers were provided . Armband removed by staff ,Pt discharged from ED. W/C  offered at D/C  and Declined W/C at D/C and was escorted to lobby by RN.  

## 2019-11-18 NOTE — Discharge Instructions (Addendum)
Please use Zyrtec, Flonase, saline nose rinses for your allergy symptoms.  Continue to take Tylenol or ibuprofen for headaches.  Return to the ER if your symptoms worsen.  I have provided the contact information for [facility] which is a family practice.  Please call this number to establish with them.  You were also swabbed for Covid, please quarantine until you receive your results.  If this is positive, you will need to quarantine for an additional 7 to 10 days.

## 2019-11-18 NOTE — ED Provider Notes (Signed)
[facility] EMERGENCY DEPARTMENT Provider Note   CSN: [id number] Arrival date & time: 11/18/19  1450     History Chief Complaint  Patient presents with  . Nasal Congestion  . Headache    Christopher Hopkins is a 49 y.o. male.  HPI 49 year old male with a history of allergies presents to the ER with congestion, runny nose and headache x1 week.  He refers that he thinks this is allergies.  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  Endorses occasional nonproductive cough.  He states he normally gets headaches when his allergies started acting up.  He has not taken anything for his headache.  Headache onset was gradual.  He denies any fevers, chills, nausea, vomiting, abdominal pain, chest pain, shortness of breath, abdominal pain, unilateral leg swelling, sore throat, vision changes, weakness, numbness, tingling, loss of taste or smell.    Past Medical History:  Diagnosis Date  . Allergies     There are no problems to display for this patient.   History reviewed. No pertinent surgical history.     Family History  Problem Relation Age of Onset  . Hypertension Father   . Stroke Father   . Thyroid disease Mother     Social History   Tobacco Use  . Smoking status: Never Smoker  . Smokeless tobacco: Never Used  Substance Use Topics  . Alcohol use: No  . Drug use: Never    Home Medications Prior to Admission medications   Medication Sig Start Date End Date Taking? Authorizing Provider  ciprofloxacin (CIPRO) 500 MG tablet Take 1 tablet (500 mg total) by mouth 2 (two) times daily. Patient not taking: Reported on 10/28/2019 10/17/15   Horton, Barbette Hair, MD    Allergies    Patient has no known allergies.  Review of Systems   Review of Systems  Constitutional: Negative for chills and fever.  HENT: Positive for congestion, rhinorrhea, sinus pressure and sneezing. Negative  for ear pain, postnasal drip, sore throat, trouble swallowing and voice change.   Eyes: Negative for pain and visual disturbance.  Respiratory: Negative for cough and shortness of breath.   Cardiovascular: Negative for chest pain and palpitations.  Gastrointestinal: Negative for abdominal pain and vomiting.  Genitourinary: Negative for dysuria and hematuria.  Musculoskeletal: Negative for arthralgias and back pain.  Skin: Negative for color change and rash.  Neurological: Positive for headaches. Negative for dizziness, seizures, syncope, facial asymmetry, weakness, light-headedness and numbness.  All other systems reviewed and are negative.   Physical Exam Updated Vital Signs BP 119/73 (BP Location: Right Arm)   Pulse 71   Temp 98.5 F (36.9 C) (Oral)   Resp 16   Ht 6\' 1"  (1.854 m)   Wt 108 kg   SpO2 98%   BMI 31.40 kg/m   Physical Exam Vitals and nursing note reviewed.  Constitutional:      General: He is not in acute distress.    Appearance: He is well-developed. He is obese. He is not ill-appearing, toxic-appearing or diaphoretic.  HENT:     Head: Normocephalic and atraumatic.     Right Ear: Tympanic membrane normal.     Left Ear: Tympanic membrane normal.     Nose: Congestion present.     Right Turbinates: Swollen.     Left Turbinates: Swollen.     Right Sinus: Frontal sinus tenderness present. No  maxillary sinus tenderness.     Left Sinus: Frontal sinus tenderness present. No maxillary sinus tenderness.     Mouth/Throat:     Mouth: Mucous membranes are moist.     Pharynx: Oropharynx is clear. No pharyngeal swelling, oropharyngeal exudate, posterior oropharyngeal erythema or uvula swelling.     Tonsils: No tonsillar exudate or tonsillar abscesses.  Eyes:     Extraocular Movements: Extraocular movements intact.     Conjunctiva/sclera: Conjunctivae normal.     Pupils: Pupils are equal, round, and reactive to light.  Neck:     Meningeal: Brudzinski's sign absent.    Cardiovascular:     Rate and Rhythm: Normal rate and regular rhythm.     Heart sounds: No murmur.  Pulmonary:     Effort: Pulmonary effort is normal. No respiratory distress.     Breath sounds: Normal breath sounds.  Abdominal:     Palpations: Abdomen is soft.     Tenderness: There is no abdominal tenderness.  Musculoskeletal:        General: Normal range of motion.     Cervical back: Normal range of motion and neck supple. No rigidity.  Skin:    General: Skin is warm and dry.     Findings: No erythema or rash.  Neurological:     Mental Status: He is alert and oriented to person, place, and time.     GCS: GCS eye subscore is 4. GCS verbal subscore is 5. GCS motor subscore is 6.     Sensory: No sensory deficit.     Motor: No weakness.     Coordination: Romberg sign negative.     Comments: Mental Status:  Alert, thought content appropriate, able to give a coherent history. Speech fluent without evidence of aphasia. Able to follow 2 step commands without difficulty.  Cranial Nerves:  II: Peripheral visual fields grossly normal, pupils equal, round, reactive to light III,IV, VI: ptosis not present, extra-ocular motions intact bilaterally  V,VII: smile symmetric, facial light touch sensation equal VIII: hearing grossly normal to voice  X: uvula elevates symmetrically  XI: bilateral shoulder shrug symmetric and strong XII: midline tongue extension without fassiculations Motor:  Normal tone. 5/5 strength of BUE and BLE major muscle groups including strong and equal grip strength and dorsiflexion/plantar flexion Sensory: light touch normal in all extremities. Cerebellar: normal finger-to-nose with bilateral upper extremities, Romberg sign absent Gait: normal gait and balance. Able to walk on toes and heels with ease.    Psychiatric:        Mood and Affect: Mood normal.        Behavior: Behavior normal.     ED Results / Procedures / Treatments   Labs (all labs ordered are  listed, but only abnormal results are displayed) Labs Reviewed  SARS CORONAVIRUS 2 (TAT 6-24 HRS)    EKG None  Radiology No results found.  Procedures Procedures (including critical care time)  Medications Ordered in ED Medications  prochlorperazine (COMPAZINE) tablet 10 mg (has no administration in time range)  diphenhydrAMINE (BENADRYL) capsule 50 mg (has no administration in time range)    ED Course  I have reviewed the triage vital signs and the nursing notes.  Pertinent labs & imaging results that were available during my care of the patient were reviewed by me and considered in my medical decision making (see chart for details).    MDM Rules/Calculators/A&P  49 year old male with allergies. Presentation to the ER, patient is alert and oriented, in no acute distress, resting comfortably in the ER bed.  Vitals overall reassuring.  Physical exam with some nasal turbinates swelling, but patient breathing normally.  No evidence of throat erythema, normal neuro exam.  Doubt stroke or meningitis. Denies thunderclap headache, doubt intracranial bleed.  Clinical picture consistent with possible sinusitis secondary to allergies.  Patient does not taken any medication for his headache.   Will treat with migraine cocktail, encouraged him to continue taking ibuprofen or Tylenol over-the-counter at home.  Encouraged him to take normal Allegra without the decongestant as this is less sedating.  Also encouraged Zyrtec, Flonase, saline rinses.  Patient voices understanding is agreeable to this plan.  Although my suspicion is low, will swab for Covid today.  At this stage in the ED course, the patient is adequately screened and is stable for discharge.  Return precautions given.  Final Clinical Impression(s) / ED Diagnoses Final diagnoses:  Seasonal allergic rhinitis, unspecified trigger  Nonintractable episodic headache, unspecified headache type    Rx / DC Orders ED  Discharge Orders    None       Leone Brand 11/18/19 1617    Blane Ohara, MD 11/19/19 0010

## 2019-11-18 NOTE — ED Triage Notes (Signed)
C/o runny nose and headache x 1 week.  Pt states he thinks it is allergies.  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  No known COVID contacts.

## 2021-01-26 DEATH — deceased

## 2022-03-11 IMAGING — CR DG CHEST 2V
2 series · 2 of 2 positions shown · non-contrast
Comparison: None.

CLINICAL DATA: Left arm tingling.

EXAM:
CHEST - 2 VIEW

[chest pa]
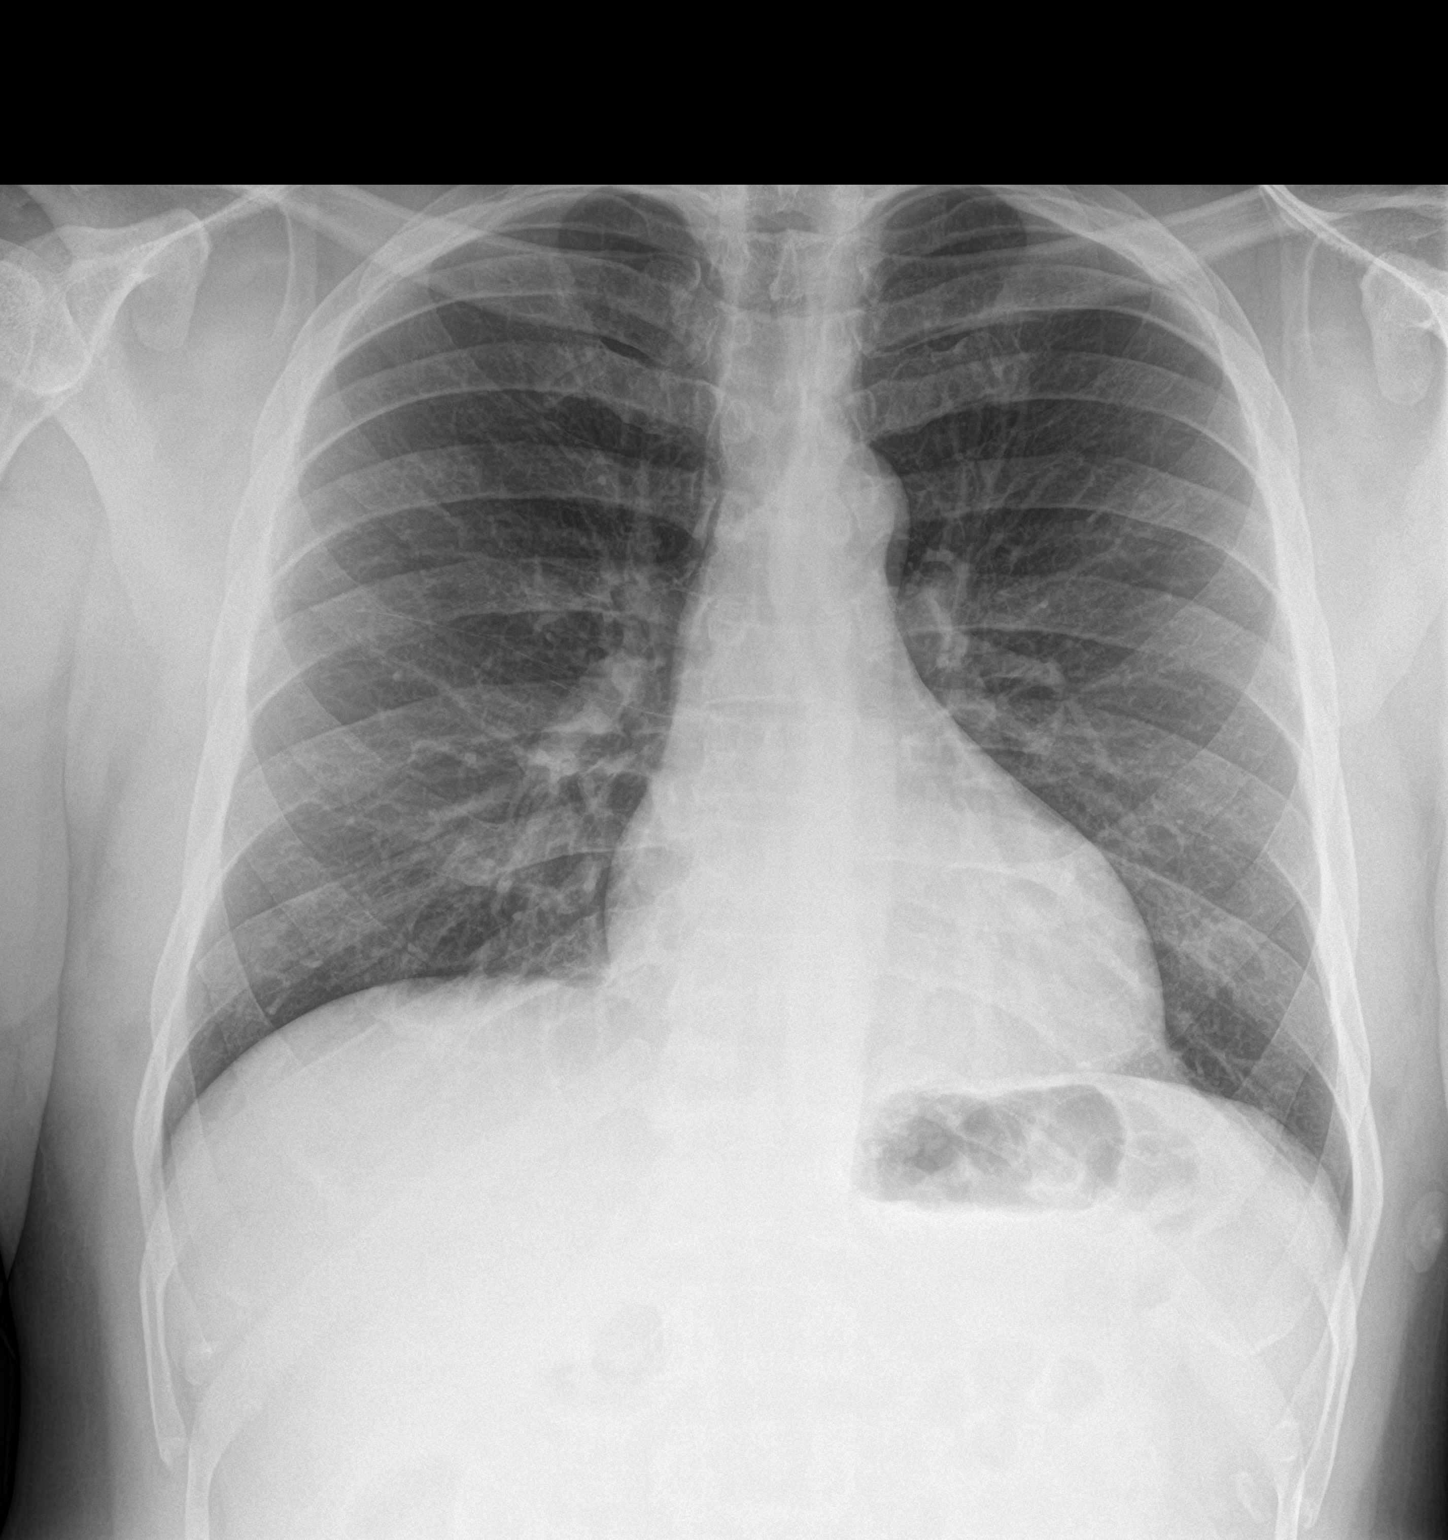

[chest lat]
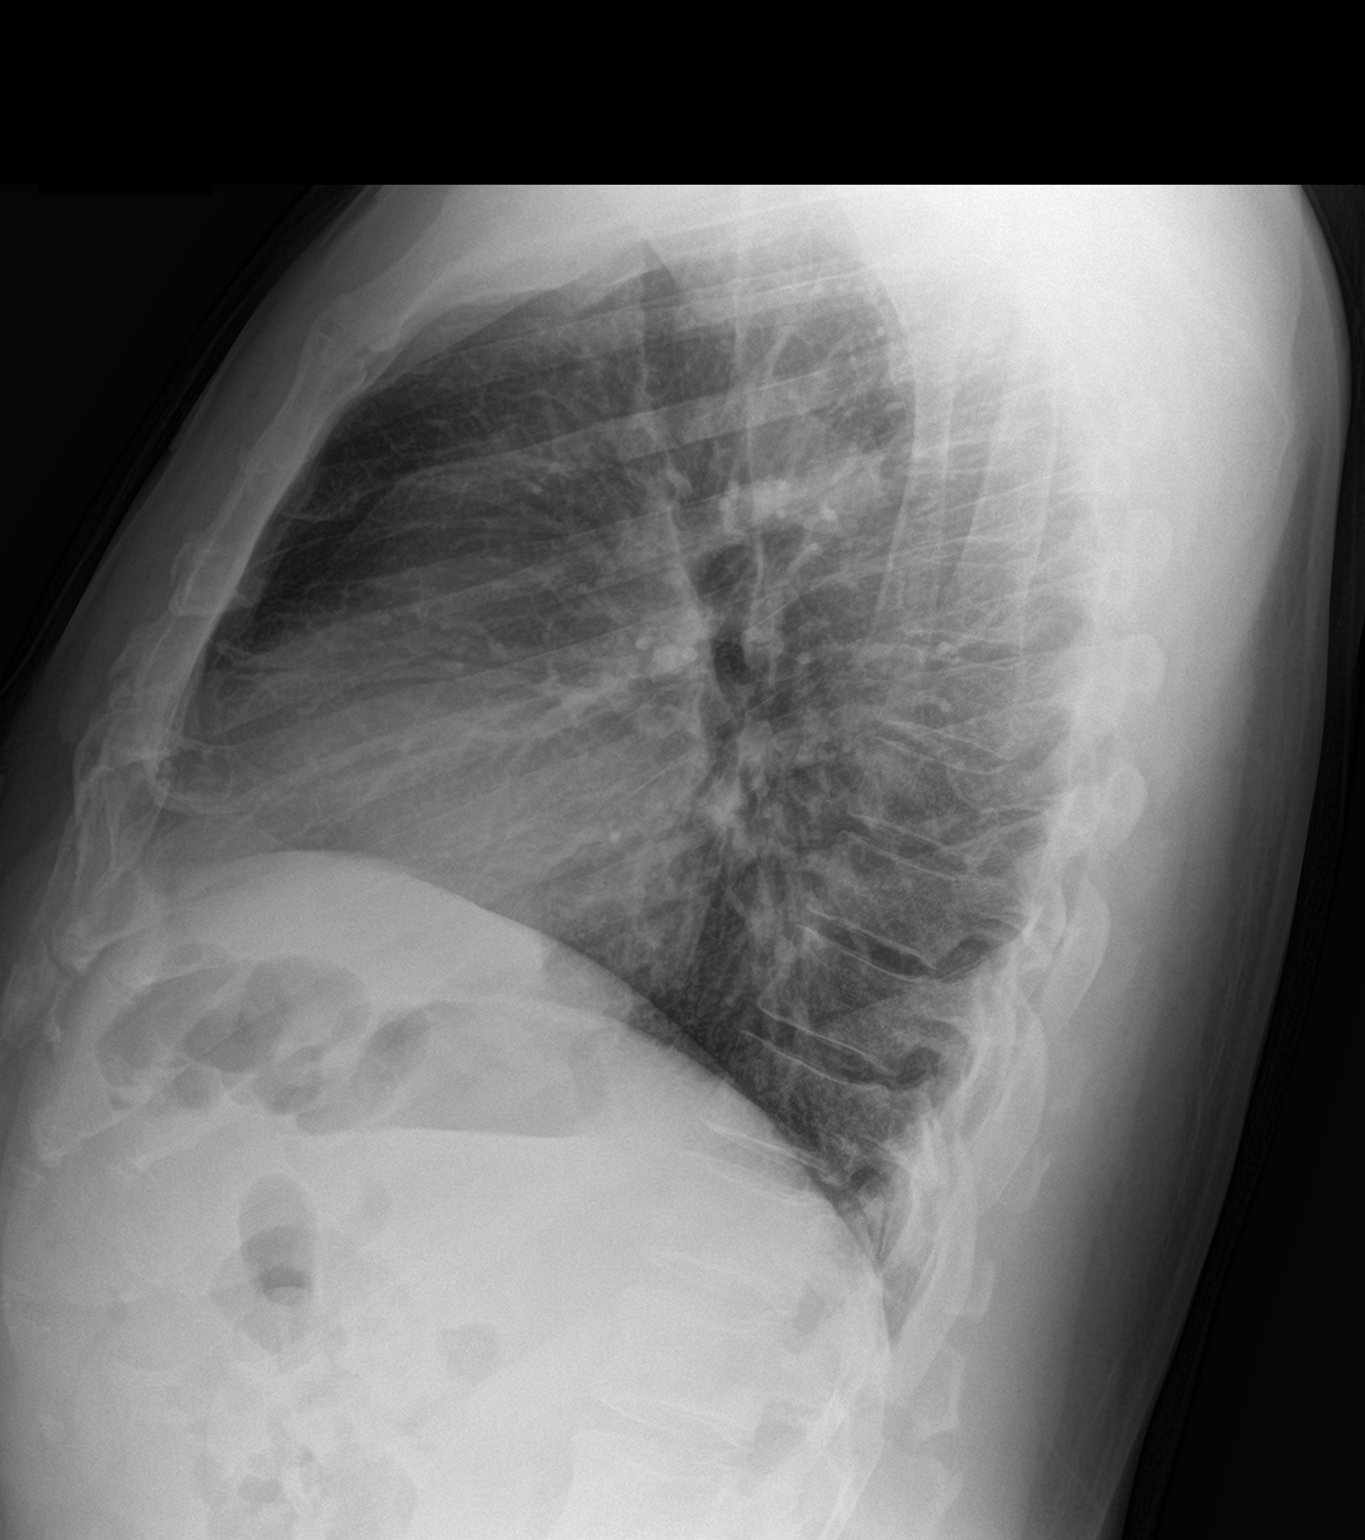

[2 of 2 positions shown; findings below may reference images not displayed]

FINDINGS: The heart size and mediastinal contours are within normal limits.
Both lungs are clear. The visualized skeletal structures are
unremarkable.
IMPRESSION: No active cardiopulmonary disease.
